# Patient Record
Sex: Female | Born: 1995 | Hispanic: No | Marital: Single | Smoking: Never smoker
Health system: Southern US, Community
[De-identification: ages and names within clinical notes are randomized; demographics above are authoritative.]

## PROBLEM LIST (undated history)

## (undated) DIAGNOSIS — D649 Anemia, unspecified: Secondary | ICD-10-CM

## (undated) DIAGNOSIS — Z8709 Personal history of other diseases of the respiratory system: Secondary | ICD-10-CM

## (undated) DIAGNOSIS — S62339A Displaced fracture of neck of unspecified metacarpal bone, initial encounter for closed fracture: Secondary | ICD-10-CM

## (undated) HISTORY — PX: WISDOM TOOTH EXTRACTION: SHX21

---

## 2015-03-23 ENCOUNTER — Encounter (HOSPITAL_COMMUNITY): Payer: Self-pay | Admitting: *Deleted

## 2015-03-23 ENCOUNTER — Emergency Department (HOSPITAL_COMMUNITY)
Admission: EM | Admit: 2015-03-23 | Discharge: 2015-03-23 | Disposition: A | Discharge status: Home / Self Care | Payer: Medicaid Other | Attending: Emergency Medicine | Admitting: Emergency Medicine

## 2015-03-23 DIAGNOSIS — N760 Acute vaginitis: Secondary | ICD-10-CM | POA: Insufficient documentation

## 2015-03-23 DIAGNOSIS — Z3202 Encounter for pregnancy test, result negative: Secondary | ICD-10-CM | POA: Diagnosis not present

## 2015-03-23 DIAGNOSIS — B9689 Other specified bacterial agents as the cause of diseases classified elsewhere: Secondary | ICD-10-CM

## 2015-03-23 DIAGNOSIS — N898 Other specified noninflammatory disorders of vagina: Secondary | ICD-10-CM | POA: Diagnosis present

## 2015-03-23 LAB — URINALYSIS, ROUTINE W REFLEX MICROSCOPIC
BILIRUBIN URINE: NEGATIVE
Glucose, UA: NEGATIVE mg/dL
HGB URINE DIPSTICK: NEGATIVE
Ketones, ur: NEGATIVE mg/dL
Nitrite: NEGATIVE
PH: 6 (ref 5.0–8.0)
Protein, ur: NEGATIVE mg/dL
SPECIFIC GRAVITY, URINE: 1.028 (ref 1.005–1.030)

## 2015-03-23 LAB — URINE MICROSCOPIC-ADD ON

## 2015-03-23 LAB — POC URINE PREG, ED: Preg Test, Ur: NEGATIVE

## 2015-03-23 LAB — WET PREP, GENITAL
Sperm: NONE SEEN
Trich, Wet Prep: NONE SEEN
YEAST WET PREP: NONE SEEN

## 2015-03-23 LAB — PREGNANCY, URINE: PREG TEST UR: NEGATIVE

## 2015-03-23 MED ORDER — METRONIDAZOLE 500 MG PO TABS: 500.0000 mg | ORAL_TABLET | Freq: Two times a day (BID) | ORAL | Status: DC

## 2015-03-23 MED ORDER — STERILE WATER FOR INJECTION IJ SOLN
INTRAMUSCULAR | Status: AC
  Administered 2015-03-23: 1 mL
  Filled 2015-03-23: qty 10

## 2015-03-23 MED ORDER — CEFTRIAXONE SODIUM 250 MG IJ SOLR
250.0000 mg | Freq: Once | INTRAMUSCULAR | Status: AC
  Administered 2015-03-23: 250 mg via INTRAMUSCULAR
  Filled 2015-03-23: qty 250

## 2015-03-23 MED ORDER — AZITHROMYCIN 250 MG PO TABS
1000.0000 mg | ORAL_TABLET | Freq: Every day | ORAL | Status: DC
  Administered 2015-03-23: 1000 mg via ORAL
  Filled 2015-03-23 (×2): qty 4

## 2015-03-23 NOTE — ED Provider Notes (Signed)
CSN: [id number]     Arrival date & time 03/23/15  1039 History   First MD Initiated Contact with Patient 03/23/15 1113     Chief Complaint  Patient presents with  . Vaginal Discharge   HPI   Amy [name] is a 20 y.o. F with no significant PMH presenting with a two month history of vaginal "spotting." She states the spotting occurs intermittently. She stopped taking her birth control at the end of November. She states her boyfriend has penile discharge, and is here in the ED getting STD tested as well. She denies fevers, chills, CP, SOB, abdominal pain, vaginal odor/itching/discharge.   History reviewed. No pertinent past medical history. History reviewed. No pertinent past surgical history. History reviewed. No pertinent family history. Social History  Substance Use Topics  . Smoking status: Never Smoker   . Smokeless tobacco: None  . Alcohol Use: Yes     Comment: social   OB History    No data available     Review of Systems  Ten systems are reviewed and are negative for acute change except as noted in the HPI  Allergies  Review of patient's allergies indicates no known allergies.  Home Medications   Prior to Admission medications   Not on File   BP 121/68 mmHg  Pulse 80  Temp(Src) 97.5 F (36.4 C) (Oral)  Resp 18  SpO2 100%  LMP 03/16/2015 Physical Exam  Constitutional: She appears well-developed and well-nourished. No distress.  HENT:  Head: Normocephalic and atraumatic.  Mouth/Throat: Oropharynx is clear and moist. No oropharyngeal exudate.  Eyes: Conjunctivae are normal. Pupils are equal, round, and reactive to light. Right eye exhibits no discharge. Left eye exhibits no discharge. No scleral icterus.  Neck: No tracheal deviation present.  Cardiovascular: Normal rate, regular rhythm, normal heart sounds and intact distal pulses.  Exam reveals no gallop and no friction rub.   No murmur heard. Pulmonary/Chest: Effort normal and breath sounds normal. No  respiratory distress. She has no wheezes. She has no rales. She exhibits no tenderness.  Abdominal: Soft. Bowel sounds are normal. She exhibits no distension and no mass. There is no tenderness. There is no rebound and no guarding.  Genitourinary:  Pelvic exam: normal external genitalia, vulva, vagina, cervix, uterus and adnexa.   Musculoskeletal: She exhibits no edema.  Lymphadenopathy:    She has no cervical adenopathy.  Neurological: She is alert. Coordination normal.  Skin: Skin is warm and dry. No rash noted. She is not diaphoretic. No erythema.  Psychiatric: She has a normal mood and affect. Her behavior is normal.  Nursing note and vitals reviewed.   ED Course  Procedures  Labs Review Labs Reviewed  RPR  HIV ANTIBODY (ROUTINE TESTING)  URINALYSIS, ROUTINE W REFLEX MICROSCOPIC (NOT AT [facility])  PREGNANCY, URINE  GC/CHLAMYDIA PROBE AMP ([facility]) NOT AT [facility]   MDM   Final diagnoses:  Bacterial vaginosis   Patient non-toxic appearing and VSS. STD testing. Hemodynamically stable.  Wet prep with BV. UA, hcg unremarkable.   Patient treated for gonorrhea/chlamydia here.   Medications  cefTRIAXone (ROCEPHIN) injection 250 mg (250 mg Intramuscular Given 03/23/15 1139)  sterile water (preservative free) injection (1 mL  Given 03/23/15 1140)   Patient feels improved after observation and/or treatment in ED.  Discussed, at length, importance of wearing condoms and using birth control to prevent STI and unwanted pregnancy. Patient may be safely discharged home with flagyl.   Discussed reasons for return. Patient to follow-up with gynecology  within one week regarding her irregular cycle. Patient in understanding and agreement with the plan.   Melton Krebs, PA-C 03/25/15 2252  Doug Sou, MD 03/27/15 [phone]

## 2015-03-23 NOTE — ED Notes (Signed)
Pt states she is spotting for 2 months.  Pt states she stopped taking birth control end of November. Pt wants to rule out STD.  Pt states no pain or medical complaints at this time.

## 2015-03-23 NOTE — Discharge Instructions (Signed)
Ms. Amy [name],  Nice meeting you! WEAR A CONDOM EVERY TIME YOU HAVE SEX. Please follow-up with your gynecologist. Return to the emergency department if you develop fevers, chills, abdominal pain. Feel better soon!  S. Lane Hacker, PA-C   Bacterial Vaginosis Bacterial vaginosis is an infection of the vagina. It happens when too many germs (bacteria) grow in the vagina. Having this infection puts you at risk for getting other infections from sex. Treating this infection can help lower your risk for other infections, such as:   Chlamydia.  Gonorrhea.  HIV.  Herpes. HOME CARE  Take your medicine as told by your doctor.  Finish your medicine even if you start to feel better.  Tell your sex partner that you have an infection. They should see their doctor for treatment.  During treatment:  Avoid sex or use condoms correctly.  Do not douche.  Do not drink alcohol unless your doctor tells you it is ok.  Do not breastfeed unless your doctor tells you it is ok. GET HELP IF:  You are not getting better after 3 days of treatment.  You have more grey fluid (discharge) coming from your vagina than before.  You have more pain than before.  You have a fever. MAKE SURE YOU:   Understand these instructions.  Will watch your condition.  Will get help right away if you are not doing well or get worse.   This information is not intended to replace advice given to you by your health care provider. Make sure you discuss any questions you have with your health care provider.   Document Released: 11/19/2007 Document Revised: 03/02/2014 Document Reviewed: 09/21/2012 Elsevier Interactive Patient Education [employer]

## 2015-03-24 LAB — RPR: RPR: NONREACTIVE

## 2015-03-24 LAB — HIV ANTIBODY (ROUTINE TESTING W REFLEX): HIV Screen 4th Generation wRfx: NONREACTIVE

## 2015-03-25 LAB — CERVICOVAGINAL ANCILLARY ONLY
CHLAMYDIA, DNA PROBE: POSITIVE — AB
NEISSERIA GONORRHEA: NEGATIVE

## 2015-03-26 ENCOUNTER — Telehealth (HOSPITAL_BASED_OUTPATIENT_CLINIC_OR_DEPARTMENT_OTHER): Payer: Self-pay | Admitting: Emergency Medicine

## 2015-03-28 NOTE — Telephone Encounter (Signed)
Spoke with pt. Verified ID. Informed of labs. Treated per protocol. Pt informed to abstain from sexual activity x 10 days and to notify partner for testing and treatment.  

## 2015-04-11 ENCOUNTER — Encounter (HOSPITAL_COMMUNITY): Payer: Self-pay | Admitting: Vascular Surgery

## 2015-04-11 ENCOUNTER — Emergency Department (HOSPITAL_COMMUNITY)
Admission: EM | Admit: 2015-04-11 | Discharge: 2015-04-11 | Disposition: A | Discharge status: Home / Self Care | Payer: Medicaid Other | Attending: Emergency Medicine | Admitting: Emergency Medicine

## 2015-04-11 DIAGNOSIS — Z202 Contact with and (suspected) exposure to infections with a predominantly sexual mode of transmission: Secondary | ICD-10-CM | POA: Insufficient documentation

## 2015-04-11 DIAGNOSIS — Z792 Long term (current) use of antibiotics: Secondary | ICD-10-CM | POA: Diagnosis not present

## 2015-04-11 DIAGNOSIS — Z7251 High risk heterosexual behavior: Secondary | ICD-10-CM

## 2015-04-11 MED ORDER — CEFTRIAXONE SODIUM 250 MG IJ SOLR
250.0000 mg | Freq: Once | INTRAMUSCULAR | Status: AC
  Administered 2015-04-11: 250 mg via INTRAMUSCULAR
  Filled 2015-04-11: qty 250

## 2015-04-11 MED ORDER — LIDOCAINE HCL (PF) 1 % IJ SOLN
0.9000 mL | Freq: Once | INTRAMUSCULAR | Status: AC
  Administered 2015-04-11: 0.9 mL
  Filled 2015-04-11: qty 5

## 2015-04-11 MED ORDER — AZITHROMYCIN 250 MG PO TABS
1000.0000 mg | ORAL_TABLET | Freq: Once | ORAL | Status: AC
  Administered 2015-04-11: 1000 mg via ORAL
  Filled 2015-04-11: qty 4

## 2015-04-11 NOTE — Discharge Instructions (Signed)
You have been treated for gonorrhea and chlamydia in the ER. DO NOT ENGAGE IN SEXUAL ACTIVITY FOR AT LEAST 10 DAYS AFTER THIS TREATMENT AS THIS WILL INVALIDATE YOUR TREATMENT HERE IF YOU ENGAGE IN SEX BEFORE THE END OF 10 DAYS AND BEFORE HAVING PARTNERS TESTED AND TREATED. ALL PARTNERS MUST BE TESTED AND TREATED FOR STD'S. Follow up with [facility] Department STD clinic for future STD concerns or screenings. This is the recommendation by the CDC for people with multiple sexual partners or history of STDs.     Sexually Transmitted Disease A sexually transmitted disease (STD) is a disease or infection often passed to another person during sex. However, STDs can be passed through nonsexual ways. An STD can be passed through:  Spit (saliva).  Semen.  Blood.  Mucus from the vagina.  Pee (urine). HOW CAN I LESSEN MY CHANCES OF GETTING AN STD?  Use:  Latex condoms.  Water-soluble lubricants with condoms. Do not use petroleum jelly or oils.  Dental dams. These are small pieces of latex that are used as a barrier during oral sex.  Avoid having more than one sex partner.  Do not have sex with someone who has other sex partners.  Do not have sex with anyone you do not know or who is at high risk for an STD.  Avoid risky sex that can break your skin.  Do not have sex if you have open sores on your mouth or skin.  Avoid drinking too much alcohol or taking illegal drugs. Alcohol and drugs can affect your good judgment.  Avoid oral and anal sex acts.  Get shots (vaccines) for HPV and hepatitis.  If you are at risk of being infected with HIV, it is advised that you take a certain medicine daily to prevent HIV infection. This is called pre-exposure prophylaxis (PrEP). You may be at risk if:  You are a man who has sex with other men (MSM).  You are attracted to the opposite sex (heterosexual) and are having sex with more than one partner.  You take drugs with a  needle.  You have sex with someone who has HIV.  Talk with your doctor about if you are at high risk of being infected with HIV. If you begin to take PrEP, get tested for HIV first. Get tested every 3 months for as long as you are taking PrEP.  Get tested for STDs every year if you are sexually active. If you are treated for an STD, get tested again 3 months after you are treated. WHAT SHOULD I DO IF I THINK I HAVE AN STD?  See your doctor.  Tell your sex partner(s) that you have an STD. They should be tested and treated.  Do not have sex until your doctor says it is okay. WHEN SHOULD I GET HELP? Get help right away if:  You have bad belly (abdominal) pain.  You are a man and have puffiness (swelling) or pain in your testicles.  You are a woman and have puffiness in your vagina.   This information is not intended to replace advice given to you by your health care provider. Make sure you discuss any questions you have with your health care provider.   Document Released: 03/19/2004 Document Revised: 03/02/2014 Document Reviewed: 08/05/2012 Elsevier Interactive Patient Education 2016 [employer].  Safe Sex Safe sex is about reducing the risk of giving or getting a sexually transmitted disease (STD). STDs are spread through sexual contact involving the  genitals, mouth, or rectum. Some STDs can be cured and others cannot. Safe sex can also prevent unintended pregnancies.  WHAT ARE SOME SAFE SEX PRACTICES?  Limit your sexual activity to only one partner who is having sex with only you.  Talk to your partner about his or her past partners, past STDs, and drug use.  Use a condom every time you have sexual intercourse. This includes vaginal, oral, and anal sexual activity. Both females and males should wear condoms during oral sex. Only use latex or polyurethane condoms and water-based lubricants. Using petroleum-based lubricants or oils to lubricate a condom will weaken the condom and  increase the chance that it will break. The condom should be in place from the beginning to the end of sexual activity. Wearing a condom reduces, but does not completely eliminate, your risk of getting or giving an STD. STDs can be spread by contact with infected body fluids and skin.  Get vaccinated for hepatitis B and HPV.  Avoid alcohol and recreational drugs, which can affect your judgment. You may forget to use a condom or participate in high-risk sex.  For females, avoid douching after sexual intercourse. Douching can spread an infection farther into the reproductive tract.  Check your body for signs of sores, blisters, rashes, or unusual discharge. See your health care provider if you notice any of these signs.  Avoid sexual contact if you have symptoms of an infection or are being treated for an STD. If you or your partner has herpes, avoid sexual contact when blisters are present. Use condoms at all other times.  If you are at risk of being infected with HIV, it is recommended that you take a prescription medicine daily to prevent HIV infection. This is called pre-exposure prophylaxis (PrEP). You are considered at risk if:  You are a man who has sex with other men (MSM).  You are a heterosexual man or woman who is sexually active with more than one partner.  You take drugs by injection.  You are sexually active with a partner who has HIV.  Talk with your health care provider about whether you are at high risk of being infected with HIV. If you choose to begin PrEP, you should first be tested for HIV. You should then be tested every 3 months for as long as you are taking PrEP.  See your health care provider for regular screenings, exams, and tests for other STDs. Before having sex with a new partner, each of you should be screened for STDs and should talk about the results with each other. WHAT ARE THE BENEFITS OF SAFE SEX?   There is less chance of getting or giving an STD.  You  can prevent unwanted or unintended pregnancies.  By discussing safe sex concerns with your partner, you may increase feelings of intimacy, comfort, trust, and honesty between the two of you.   This information is not intended to replace advice given to you by your health care provider. Make sure you discuss any questions you have with your health care provider.   Document Released: 03/19/2004 Document Revised: 03/02/2014 Document Reviewed: 08/03/2011 Elsevier Interactive Patient Education [employer]

## 2015-04-11 NOTE — ED Notes (Signed)
PT reports to the ED for eval of possible STI exposure. She had unprotected sex 1 week ago and her partner had some clear penile discharge with white specks in it so she wants to be tested. Denies any s/s such as vagin d/c, bleeding, abd pain, or urinary symptoms. Pt A&Ox4, resp e/u, and skin warm and dry.

## 2015-04-11 NOTE — ED Notes (Signed)
Patient is alert and orientedx4.  Patient was explained discharge instructions and they understood them with no questions.   

## 2015-04-11 NOTE — ED Provider Notes (Signed)
CSN: [id number]     Arrival date & time 04/11/15  2213 History  By signing my name below, I, Amy [name], attest that this documentation has been prepared under the direction and in the presence of [name], [name]. Electronically Signed: Tanda [name], ED Scribe. 04/11/2015. 10:33 PM.   Chief Complaint  Patient presents with  . Exposure to STD   Patient is a 20 y.o. female presenting with STD exposure. The history is provided by the patient and medical records. No language interpreter was used.  Exposure to STD This is a new problem. The current episode started more than 1 week ago. The problem occurs every several days. The problem has not changed since onset.Pertinent negatives include no chest pain, no abdominal pain, no headaches and no shortness of breath. Nothing aggravates the symptoms. Nothing relieves the symptoms. She has tried nothing for the symptoms. The treatment provided no relief.     HPI Comments: Amy [name] is a 20 y.o. female who presents to the Emergency Department for STD treatment. Pt states that she and her boyfriend were tested and treated for STDs on 03/23/2015 (approximately 2 weeks ago), and her chlamydia test was positive but she had been given tx on the 28th during her visit. She admits that she did not abstain from sex during the 10 day period she was told to abstain for, so she is concerned that she needs to be re-treated given that her boyfriend was seen again in the ED earlier today for penile discharge (clear with white specks) and was re-checked/re-treated for STDs again. She is here to be treated for the same again. Pt does not want to be tested for STDs and doesn't want a pelvic exam today due to being on her period at this time. She is sexually active and reports having 2 unprotected female partners within the past year, one of whom for the last 9 months which is the boyfriend she currently has.   She denies fever, chills, chest pain, shortness of breath,  abdominal pain, nausea, vomiting, diarrhea, constipation, dysuria, hematuria, vaginal discharge, genital lesions, genital ulcers, weakness, numbness, tingling, or any other associated symptoms. She is currently menstruating so she had vaginal bleeding, as expected.  History reviewed. No pertinent past medical history. History reviewed. No pertinent past surgical history. No family history on file. Social History  Substance Use Topics  . Smoking status: Never Smoker   . Smokeless tobacco: None  . Alcohol Use: Yes     Comment: social   OB History    No data available     Review of Systems  Constitutional: Negative for fever and chills.  Respiratory: Negative for shortness of breath.   Cardiovascular: Negative for chest pain.  Gastrointestinal: Negative for nausea, vomiting, abdominal pain, diarrhea and constipation.  Genitourinary: Positive for vaginal bleeding (on menses). Negative for dysuria, hematuria, vaginal discharge and genital sores.  Skin: Negative for color change.  Allergic/Immunologic: Negative for immunocompromised state.  Neurological: Negative for weakness, numbness and headaches.  10 Systems reviewed and all are negative for acute change except as noted in the HPI.   Allergies  Review of patient's allergies indicates no known allergies.  Home Medications   Prior to Admission medications   Medication Sig Start Date End Date Taking? Authorizing Provider  metroNIDAZOLE (FLAGYL) 500 MG tablet Take 1 tablet (500 mg total) by mouth 2 (two) times daily. 03/23/15   Melton Krebs, PA-C   BP 151/96 mmHg  Pulse 72  Temp(Src) 98.8  F (37.1 C) (Oral)  Resp 18  SpO2 100%  LMP 04/08/2015   Physical Exam  Constitutional: She is oriented to person, place, and time. Vital signs are normal. She appears well-developed and well-nourished.  Non-toxic appearance. No distress.  Afebrile, nontoxic, NAD  HENT:  Head: Normocephalic and atraumatic.  Mouth/Throat:  Oropharynx is clear and moist and mucous membranes are normal.  Eyes: Conjunctivae and EOM are normal. Right eye exhibits no discharge. Left eye exhibits no discharge.  Neck: Normal range of motion. Neck supple.  Cardiovascular: Normal rate, regular rhythm, normal heart sounds and intact distal pulses.  Exam reveals no gallop and no friction rub.   No murmur heard. Pulmonary/Chest: Effort normal and breath sounds normal. No respiratory distress. She has no decreased breath sounds. She has no wheezes. She has no rhonchi. She has no rales.  Abdominal: Soft. Normal appearance and bowel sounds are normal. She exhibits no distension. There is no tenderness. There is no rigidity, no rebound, no guarding, no CVA tenderness, no tenderness at McBurney's point and negative Murphy's sign.  Soft, NTND, +BS throughout, no r/g/r, neg murphy's, neg mcburney's, no CVA TTP  Genitourinary:  Refused pelvic exam.   Musculoskeletal: Normal range of motion.  Neurological: She is alert and oriented to person, place, and time. She has normal strength. No sensory deficit.  Skin: Skin is warm, dry and intact. No rash noted.  Psychiatric: She has a normal mood and affect.  Nursing note and vitals reviewed.   ED Course  Procedures (including critical care time)  DIAGNOSTIC STUDIES: Oxygen Saturation is 100% on RA, normal by my interpretation.    COORDINATION OF CARE: 10:29 PM-Discussed treatment plan which includes Zithromax and Rocephin injection with pt at bedside and pt agreed to plan.   Labs Review Labs Reviewed - No data to display Results for orders placed or performed during the hospital encounter of 03/23/15  Wet prep, genital  Result Value Ref Range   Yeast Wet Prep HPF POC NONE SEEN NONE SEEN   Trich, Wet Prep NONE SEEN NONE SEEN   Clue Cells Wet Prep HPF POC PRESENT (A) NONE SEEN   WBC, Wet Prep HPF POC MANY (A) NONE SEEN   Sperm NONE SEEN   RPR  Result Value Ref Range   RPR Ser Ql Non Reactive  Non Reactive  HIV antibody  Result Value Ref Range   HIV Screen 4th Generation wRfx Non Reactive Non Reactive  Pregnancy, urine  Result Value Ref Range   Preg Test, Ur NEGATIVE NEGATIVE  Urinalysis, Routine w reflex microscopic (not at [facility])  Result Value Ref Range   Color, Urine YELLOW YELLOW   APPearance HAZY (A) CLEAR   Specific Gravity, Urine 1.028 1.005 - 1.030   pH 6.0 5.0 - 8.0   Glucose, UA NEGATIVE NEGATIVE mg/dL   Hgb urine dipstick NEGATIVE NEGATIVE   Bilirubin Urine NEGATIVE NEGATIVE   Ketones, ur NEGATIVE NEGATIVE mg/dL   Protein, ur NEGATIVE NEGATIVE mg/dL   Nitrite NEGATIVE NEGATIVE   Leukocytes, UA SMALL (A) NEGATIVE  Urine microscopic-add on  Result Value Ref Range   Squamous Epithelial / LPF 6-30 (A) NONE SEEN   WBC, UA 0-5 0 - 5 WBC/hpf   RBC / HPF 0-5 0 - 5 RBC/hpf   Bacteria, UA FEW (A) NONE SEEN   Urine-Other MUCOUS PRESENT   POC Urine Pregnancy, ED (do NOT order at [facility])  Result Value Ref Range   Preg Test, Ur NEGATIVE NEGATIVE  Cervicovaginal ancillary  only  Result Value Ref Range   Chlamydia **POSITIVE** (A)    Neisseria gonorrhea Negative     Imaging Review No results found.    EKG Interpretation None      MDM   Final diagnoses:  Potential exposure to STD  Unprotected sexual intercourse    21 y.o. female here for re-treatment of STDs. She was seen here 2wks ago, tested +chlamydia, had been treated already during the initial ER visit, but she had intercourse before the 10 day period was finished so she's worried that she needs to be retreated. Her boyfriend had some discharge still so he came back to the ER today and was re-treated as well, so she wants this done for her. She does not want a repeat pelvic exam, since she's on her menses. She already had HIV/RPR testing which was neg. I feel it's reasonable to proceed with tx alone without testing again, since she had the testing from 2wks ago which was positive and her partner was re-treated  today as well for his ongoing symptoms. Discussed safe sex, no intercourse until 10 days from now, and f/up with health dept for any future STD concerns. I explained the diagnosis and have given explicit precautions to return to the ER including for any other new or worsening symptoms. The patient understands and accepts the medical plan as it's been dictated and I have answered their questions. Discharge instructions concerning home care and prescriptions have been given. The patient is STABLE and is discharged to home in good condition.   I personally performed the services described in this documentation, which was scribed in my presence. The recorded information has been reviewed and is accurate.  BP 151/96 mmHg  Pulse 72  Temp(Src) 98.8 F (37.1 C) (Oral)  Resp 18  SpO2 100%  LMP 04/08/2015  Meds ordered this encounter  Medications  . azithromycin (ZITHROMAX) tablet 1,000 mg    Sig:    And  . cefTRIAXone (ROCEPHIN) injection 250 mg    Sig:     Order Specific Question:  Antibiotic Indication:    Answer:  STD        Amy Sokol Camprubi-Soms, PA-C 04/11/15 2240  Glynn Octave, MD 04/11/15 2320

## 2015-04-28 ENCOUNTER — Emergency Department (HOSPITAL_COMMUNITY): Payer: Medicaid Other

## 2015-04-28 ENCOUNTER — Emergency Department (HOSPITAL_COMMUNITY)
Admission: EM | Admit: 2015-04-28 | Discharge: 2015-04-28 | Disposition: A | Discharge status: Home / Self Care | Payer: Medicaid Other | Attending: Emergency Medicine | Admitting: Emergency Medicine

## 2015-04-28 ENCOUNTER — Encounter (HOSPITAL_COMMUNITY): Payer: Self-pay | Admitting: Emergency Medicine

## 2015-04-28 DIAGNOSIS — Y9289 Other specified places as the place of occurrence of the external cause: Secondary | ICD-10-CM | POA: Insufficient documentation

## 2015-04-28 DIAGNOSIS — S6991XA Unspecified injury of right wrist, hand and finger(s), initial encounter: Secondary | ICD-10-CM | POA: Diagnosis present

## 2015-04-28 DIAGNOSIS — Z3202 Encounter for pregnancy test, result negative: Secondary | ICD-10-CM | POA: Diagnosis not present

## 2015-04-28 DIAGNOSIS — S62339A Displaced fracture of neck of unspecified metacarpal bone, initial encounter for closed fracture: Secondary | ICD-10-CM

## 2015-04-28 DIAGNOSIS — Y9389 Activity, other specified: Secondary | ICD-10-CM | POA: Diagnosis not present

## 2015-04-28 DIAGNOSIS — W2209XA Striking against other stationary object, initial encounter: Secondary | ICD-10-CM | POA: Insufficient documentation

## 2015-04-28 DIAGNOSIS — Y999 Unspecified external cause status: Secondary | ICD-10-CM | POA: Insufficient documentation

## 2015-04-28 DIAGNOSIS — S62316A Displaced fracture of base of fifth metacarpal bone, right hand, initial encounter for closed fracture: Secondary | ICD-10-CM | POA: Diagnosis not present

## 2015-04-28 HISTORY — DX: Displaced fracture of neck of unspecified metacarpal bone, initial encounter for closed fracture: S62.339A

## 2015-04-28 LAB — POC URINE PREG, ED: Preg Test, Ur: NEGATIVE

## 2015-04-28 NOTE — ED Provider Notes (Signed)
CSN: [id number]     Arrival date & time 04/28/15  6962 History  By signing my name below, I, [name], attest that this documentation has been prepared under the direction and in the presence of Benjiman Core, MD. Electronically Signed: [name], ED Scribe. 04/28/2015. 3:52 AM.    Chief Complaint  Patient presents with  . Hand Injury   The history is provided by the patient. No language interpreter was used.   HPI Comments: Amy [name] is a 20 y.o. female otherwise healthy who presents to the Emergency Department complaining of moderate right pinky finger and hand pain onset just PTA after punching an outdoor column with her right hand. Pt denies taking OTC medications at home to improve symptoms. She states that pain is worsened with movement and palpation. NKDA. Pt expresses doubt that she is pregnant. She denies numbness, additional injuries.   History reviewed. No pertinent past medical history. History reviewed. No pertinent past surgical history. No family history on file. Social History  Substance Use Topics  . Smoking status: Never Smoker   . Smokeless tobacco: None  . Alcohol Use: Yes     Comment: social   OB History    No data available     Review of Systems  Respiratory: Negative for shortness of breath.   Gastrointestinal: Negative for abdominal pain.  Musculoskeletal: Positive for arthralgias.  Neurological: Negative for weakness and numbness.   Allergies  Review of patient's allergies indicates no known allergies.  Home Medications   Prior to Admission medications   Not on File   BP 135/89 mmHg  Pulse 104  Temp(Src) 98.7 F (37.1 C) (Oral)  Resp 20  SpO2 100%  LMP 04/08/2015 Physical Exam  Constitutional: She appears well-developed and well-nourished.  HENT:  Head: Normocephalic and atraumatic.  Cardiovascular: Normal rate.   Musculoskeletal: Normal range of motion. She exhibits tenderness.   Tenderness and deformity of the distal 5th  metacarpal. Skin intact. Strong radial pulse. NVI over the right little finger.   Neurological: She is alert.  Skin: Skin is warm and dry.  Psychiatric: She has a normal mood and affect.  Nursing note and vitals reviewed.   ED Course  Procedures (including critical care time) DIAGNOSTIC STUDIES: Oxygen Saturation is 100% on RA, normal by my interpretation.    COORDINATION OF CARE: 3:49 AM Discussed treatment plan with pt at bedside which includes XR and pt agreed to plan.    Imaging Review Dg Hand Complete Right  04/28/2015  CLINICAL DATA:  Punched pillar, with right fifth metacarpal swelling and deformity. Initial encounter. EXAM: RIGHT HAND - COMPLETE 3+ VIEW COMPARISON:  None. FINDINGS: There is a comminuted fracture of the distal fifth metacarpal, with radial displacement and radial and volar angulation. The distal fragment is rotated radially. Associated soft tissue swelling is noted. No additional fractures are seen. The carpal rows appear grossly intact, and demonstrate normal alignment. IMPRESSION: Comminuted fracture of the distal fifth metacarpal, with radial displacement and radial and volar angulation. The distal fragment is rotated radially. Electronically Signed   By: Roanna Raider M.D.   On: 04/28/2015 05:05   I have personally reviewed and evaluated these images as part of my medical decision-making.  MDM   Final diagnoses:  Boxer's fracture, closed, initial encounter    Patient with closed fracture of the distal right fifth metacarpal. She is right-handed. Discussed with Dr. Merlyn Lot, who reviewed the x-rays. Will likely need a pin but will see in follow-up on Monday.  I personally performed the services described in this documentation, which was scribed in my presence. The recorded information has been reviewed and is accurate.     Benjiman CoreNathan Tasmia Blumer, MD 04/28/15 [phone]

## 2015-04-28 NOTE — ED Notes (Signed)
Pt hit a hard service with R hand. C.o pain to area.

## 2015-04-28 NOTE — ED Notes (Signed)
Ortho tech paged  

## 2015-04-28 NOTE — Discharge Instructions (Signed)
Metacarpal Fracture °A metacarpal fracture is a break (fracture) of a bone in the hand. Metacarpals are the bones that extend from your knuckles to your wrist. In each hand, you have five metacarpal bones that connect your fingers and your thumb to your wrist. °Some hand fractures have bone pieces that are close together and stable (simple). These fractures may be treated with only a splint or cast. Hand fractures that have many pieces of broken bone (comminuted), unstable bone pieces (displaced), or a bone that breaks through the skin (compound) usually require surgery. °CAUSES °This injury may be caused by: °· A fall. °· A hard, direct hit to your hand. °· An injury that squeezes your knuckle, stretches your finger out of place, or crushes your hand. °RISK FACTORS °This injury is more likely to occur if: °· You play contact sports. °· You have certain bone diseases. °SYMPTOMS  °Symptoms of this type of fracture develop soon after the injury. Symptoms may include: °· Swelling. °· Pain. °· Stiffness. °· Increased pain with movement. °· Bruising. °· Inability to move a finger. °· A shortened finger. °· A finger knuckle that looks sunken in. °· Unusual appearance of the hand or finger (deformity). °DIAGNOSIS  °This injury may be diagnosed based on your signs and symptoms, especially if you had a recent hand injury. Your health care provider will perform a physical exam. He or she may also order X-rays to confirm the diagnosis.  °TREATMENT  °Treatment for this injury depends on the type of fracture you have and how severe it is. Possible treatments include: °· Non-reduction. This can be done if the bone does not need to be moved back into place. The fracture can be casted or splinted as it is.   °· Closed reduction. If your bone is stable and can be moved back into place, you may only need to wear a cast or splint or have buddy taping. °· Closed reduction with internal fixation (CRIF). This is the most common  treatment. You may have this procedure if your bone can be moved back into place but needs more support. Wires, pins, or screws may be inserted through your skin to stabilize the fracture. °· Open reduction with internal fixation (ORIF). This may be needed if your fracture is severe and unstable. It involves surgery to move your bone back into the right position. Screws, wires, or plates are used to stabilize the fracture. °After all procedures, you may need to wear a cast or a splint for several weeks. You will also need to have follow-up X-rays to make sure that the bone is healing well and staying in position. After you no longer need your cast or splint, you may need physical therapy. This will help you to regain full movement and strength in your hand.  °HOME CARE INSTRUCTIONS  °If You Have a Cast: °· Do not stick anything inside the cast to scratch your skin. Doing that increases your risk of infection. °· Check the skin around the cast every day. Report any concerns to your health care provider. You may put lotion on dry skin around the edges of the cast. Do not apply lotion to the skin underneath the cast. °If You Have a Splint: °· Wear it as directed by your health care provider. Remove it only as directed by your health care provider. °· Loosen the splint if your fingers become numb and tingle, or if they turn cold and blue. °Bathing °· Cover the cast or splint with a   watertight plastic bag to protect it from water while you take a bath or a shower. Do not let the cast or splint get wet. °Managing Pain, Stiffness, and Swelling °· If directed, apply ice to the injured area (if you have a splint, not a cast): °¨ Put ice in a plastic bag. °¨ Place a towel between your skin and the bag. °¨ Leave the ice on for 20 minutes, 2-3 times a day. °· Move your fingers often to avoid stiffness and to lessen swelling. °· Raise the injured area above the level of your heart while you are sitting or lying  down. °Driving °· Do not drive or operate heavy machinery while taking pain medicine. °· Do not drive while wearing a cast or splint on a hand that you use for driving. °Activity °· Return to your normal activities as directed by your health care provider. Ask your health care provider what activities are safe for you. °General Instructions °· Do not put pressure on any part of the cast or splint until it is fully hardened. This may take several hours. °· Keep the cast or splint clean and dry. °· Do not use any tobacco products, including cigarettes, chewing tobacco, or electronic cigarettes. Tobacco can delay bone healing. If you need help quitting, ask your health care provider. °· Take medicines only as directed by your health care provider. °· Keep all follow-up visits as directed by your health care provider. This is important. °SEEK MEDICAL CARE IF:  °· Your pain is getting worse. °· You have redness, swelling, or pain in the injured area.   °· You have fluid, blood, or pus coming from under your cast or splint.   °· You notice a bad smell coming from under your cast or splint.   °· You have a fever.   °SEEK IMMEDIATE MEDICAL CARE IF:  °· You develop a rash.   °· You have trouble breathing.   °· Your skin or nails on your injured hand turn blue or gray even after you loosen your splint. °· Your injured hand feels cold or becomes numb even after you loosen your splint.   °· You develop severe pain under the cast or in your hand. °  °This information is not intended to replace advice given to you by your health care provider. Make sure you discuss any questions you have with your health care provider. °  °Document Released: 02/09/2005 Document Revised: 10/31/2014 Document Reviewed: 11/29/2013 °Elsevier Interactive Patient Education ©2016 [employer] ° °

## 2015-04-28 NOTE — ED Notes (Signed)
Patient verbalized understanding of discharge instructions and denies any further needs or questions at this time. VS stable. Patient ambulatory with steady gait.  

## 2015-05-03 ENCOUNTER — Encounter (HOSPITAL_BASED_OUTPATIENT_CLINIC_OR_DEPARTMENT_OTHER): Payer: Self-pay | Admitting: *Deleted

## 2015-05-03 ENCOUNTER — Other Ambulatory Visit: Payer: Self-pay | Admitting: Orthopedic Surgery

## 2015-05-07 ENCOUNTER — Ambulatory Visit (HOSPITAL_BASED_OUTPATIENT_CLINIC_OR_DEPARTMENT_OTHER)
Admission: RE | Admit: 2015-05-07 | Discharge: 2015-05-07 | Disposition: A | Discharge status: Home / Self Care | Payer: Medicaid Other | Source: Ambulatory Visit | Attending: Orthopedic Surgery | Admitting: Orthopedic Surgery

## 2015-05-07 ENCOUNTER — Encounter (HOSPITAL_BASED_OUTPATIENT_CLINIC_OR_DEPARTMENT_OTHER)
Admission: RE | Disposition: A | Discharge status: Home / Self Care | Payer: Self-pay | Source: Ambulatory Visit | Attending: Orthopedic Surgery

## 2015-05-07 ENCOUNTER — Ambulatory Visit (HOSPITAL_BASED_OUTPATIENT_CLINIC_OR_DEPARTMENT_OTHER): Payer: Medicaid Other | Admitting: Anesthesiology

## 2015-05-07 ENCOUNTER — Encounter (HOSPITAL_BASED_OUTPATIENT_CLINIC_OR_DEPARTMENT_OTHER): Payer: Self-pay

## 2015-05-07 DIAGNOSIS — W2209XA Striking against other stationary object, initial encounter: Secondary | ICD-10-CM | POA: Insufficient documentation

## 2015-05-07 DIAGNOSIS — S62336A Displaced fracture of neck of fifth metacarpal bone, right hand, initial encounter for closed fracture: Secondary | ICD-10-CM | POA: Insufficient documentation

## 2015-05-07 HISTORY — PX: CLOSED REDUCTION METACARPAL WITH PERCUTANEOUS PINNING: SHX5613

## 2015-05-07 HISTORY — DX: Displaced fracture of neck of unspecified metacarpal bone, initial encounter for closed fracture: S62.339A

## 2015-05-07 HISTORY — DX: Anemia, unspecified: D64.9

## 2015-05-07 HISTORY — DX: Personal history of other diseases of the respiratory system: Z87.09

## 2015-05-07 SURGERY — CLOSED REDUCTION, FRACTURE, METACARPAL BONE, WITH PERCUTANEOUS PINNING
Anesthesia: General | Site: Hand | Laterality: Right

## 2015-05-07 MED ORDER — MEPERIDINE HCL 25 MG/ML IJ SOLN: 6.2500 mg | INTRAMUSCULAR | Status: DC | PRN

## 2015-05-07 MED ORDER — GLYCOPYRROLATE 0.2 MG/ML IJ SOLN: 0.2000 mg | Freq: Once | INTRAMUSCULAR | Status: DC | PRN

## 2015-05-07 MED ORDER — HYDROMORPHONE HCL 1 MG/ML IJ SOLN
INTRAMUSCULAR | Status: AC
  Filled 2015-05-07: qty 1

## 2015-05-07 MED ORDER — ONDANSETRON HCL 4 MG/2ML IJ SOLN
INTRAMUSCULAR | Status: AC
  Filled 2015-05-07: qty 2

## 2015-05-07 MED ORDER — LIDOCAINE HCL (CARDIAC) 20 MG/ML IV SOLN
INTRAVENOUS | Status: DC | PRN
  Administered 2015-05-07: 100 mg via INTRAVENOUS

## 2015-05-07 MED ORDER — HYDROMORPHONE HCL 1 MG/ML IJ SOLN
0.2500 mg | INTRAMUSCULAR | Status: DC | PRN
  Administered 2015-05-07 (×3): 0.5 mg via INTRAVENOUS

## 2015-05-07 MED ORDER — CHLORHEXIDINE GLUCONATE 4 % EX LIQD: 60.0000 mL | Freq: Once | CUTANEOUS | Status: DC

## 2015-05-07 MED ORDER — SCOPOLAMINE 1 MG/3DAYS TD PT72
1.0000 | MEDICATED_PATCH | Freq: Once | TRANSDERMAL | Status: DC | PRN
Start: 2015-05-07 — End: 2015-05-07

## 2015-05-07 MED ORDER — PROPOFOL 10 MG/ML IV BOLUS
INTRAVENOUS | Status: AC
  Filled 2015-05-07: qty 20

## 2015-05-07 MED ORDER — LACTATED RINGERS IV SOLN
INTRAVENOUS | Status: DC
  Administered 2015-05-07: 12:00:00 via INTRAVENOUS

## 2015-05-07 MED ORDER — OXYCODONE HCL 5 MG PO TABS: 5.0000 mg | ORAL_TABLET | Freq: Once | ORAL | Status: DC | PRN

## 2015-05-07 MED ORDER — OXYCODONE-ACETAMINOPHEN 5-325 MG PO TABS
ORAL_TABLET | ORAL | Status: AC
Start: 2015-05-07 — End: ?

## 2015-05-07 MED ORDER — DEXAMETHASONE SODIUM PHOSPHATE 4 MG/ML IJ SOLN
INTRAMUSCULAR | Status: DC | PRN
  Administered 2015-05-07: 10 mg via INTRAVENOUS

## 2015-05-07 MED ORDER — ONDANSETRON HCL 4 MG/2ML IJ SOLN
INTRAMUSCULAR | Status: DC | PRN
  Administered 2015-05-07: 4 mg via INTRAVENOUS

## 2015-05-07 MED ORDER — OXYCODONE HCL 5 MG/5ML PO SOLN: 5.0000 mg | Freq: Once | ORAL | Status: DC | PRN

## 2015-05-07 MED ORDER — BUPIVACAINE HCL (PF) 0.25 % IJ SOLN
INTRAMUSCULAR | Status: DC | PRN
  Administered 2015-05-07: 8 mL

## 2015-05-07 MED ORDER — FENTANYL CITRATE (PF) 100 MCG/2ML IJ SOLN
50.0000 ug | INTRAMUSCULAR | Status: DC | PRN
  Administered 2015-05-07: 100 ug via INTRAVENOUS

## 2015-05-07 MED ORDER — CEFAZOLIN SODIUM-DEXTROSE 2-3 GM-% IV SOLR
INTRAVENOUS | Status: AC
  Filled 2015-05-07: qty 50

## 2015-05-07 MED ORDER — CEFAZOLIN SODIUM-DEXTROSE 2-3 GM-% IV SOLR
2.0000 g | INTRAVENOUS | Status: AC
  Administered 2015-05-07: 2 g via INTRAVENOUS

## 2015-05-07 MED ORDER — MIDAZOLAM HCL 2 MG/2ML IJ SOLN: 1.0000 mg | INTRAMUSCULAR | Status: DC | PRN

## 2015-05-07 MED ORDER — PROPOFOL 10 MG/ML IV BOLUS
INTRAVENOUS | Status: DC | PRN
  Administered 2015-05-07: 200 mg via INTRAVENOUS

## 2015-05-07 MED ORDER — FENTANYL CITRATE (PF) 100 MCG/2ML IJ SOLN
INTRAMUSCULAR | Status: AC
  Filled 2015-05-07: qty 2

## 2015-05-07 MED ORDER — LIDOCAINE HCL (CARDIAC) 20 MG/ML IV SOLN
INTRAVENOUS | Status: AC
  Filled 2015-05-07: qty 5

## 2015-05-07 MED ORDER — DEXAMETHASONE SODIUM PHOSPHATE 10 MG/ML IJ SOLN
INTRAMUSCULAR | Status: AC
  Filled 2015-05-07: qty 1

## 2015-05-07 SURGICAL SUPPLY — 57 items
BANDAGE ACE 3X5.8 VEL STRL LF (GAUZE/BANDAGES/DRESSINGS) ×4 IMPLANT
BLADE MINI RND TIP GREEN BEAV (BLADE) IMPLANT
BLADE SURG 15 STRL LF DISP TIS (BLADE) IMPLANT
BLADE SURG 15 STRL SS (BLADE)
BNDG ELASTIC 2X5.8 VLCR STR LF (GAUZE/BANDAGES/DRESSINGS) IMPLANT
BNDG ESMARK 4X9 LF (GAUZE/BANDAGES/DRESSINGS) ×4 IMPLANT
BNDG GAUZE ELAST 4 BULKY (GAUZE/BANDAGES/DRESSINGS) ×4 IMPLANT
CHLORAPREP W/TINT 26ML (MISCELLANEOUS) ×4 IMPLANT
CORDS BIPOLAR (ELECTRODE) IMPLANT
COVER BACK TABLE 60X90IN (DRAPES) ×4 IMPLANT
COVER MAYO STAND STRL (DRAPES) ×4 IMPLANT
CUFF TOURNIQUET SINGLE 18IN (TOURNIQUET CUFF) ×4 IMPLANT
DRAPE EXTREMITY T 121X128X90 (DRAPE) ×4 IMPLANT
DRAPE OEC MINIVIEW 54X84 (DRAPES) ×4 IMPLANT
DRAPE SURG 17X23 STRL (DRAPES) ×4 IMPLANT
GAUZE SPONGE 4X4 12PLY STRL (GAUZE/BANDAGES/DRESSINGS) ×4 IMPLANT
GAUZE XEROFORM 1X8 LF (GAUZE/BANDAGES/DRESSINGS) ×4 IMPLANT
GLOVE BIO SURGEON STRL SZ7.5 (GLOVE) ×4 IMPLANT
GLOVE BIOGEL PI IND STRL 7.0 (GLOVE) ×4 IMPLANT
GLOVE BIOGEL PI IND STRL 8 (GLOVE) ×2 IMPLANT
GLOVE BIOGEL PI IND STRL 8.5 (GLOVE) ×2 IMPLANT
GLOVE BIOGEL PI INDICATOR 7.0 (GLOVE) ×4
GLOVE BIOGEL PI INDICATOR 8 (GLOVE) ×2
GLOVE BIOGEL PI INDICATOR 8.5 (GLOVE) ×2
GLOVE SURG ORTHO 8.0 STRL STRW (GLOVE) ×4 IMPLANT
GOWN STRL REUS W/ TWL LRG LVL3 (GOWN DISPOSABLE) ×2 IMPLANT
GOWN STRL REUS W/ TWL XL LVL3 (GOWN DISPOSABLE) ×4 IMPLANT
GOWN STRL REUS W/TWL LRG LVL3 (GOWN DISPOSABLE) ×2
GOWN STRL REUS W/TWL XL LVL3 (GOWN DISPOSABLE) ×12 IMPLANT
K-WIRE .035X4 (WIRE) ×8 IMPLANT
NEEDLE HYPO 22GX1.5 SAFETY (NEEDLE) IMPLANT
NEEDLE HYPO 25X1 1.5 SAFETY (NEEDLE) ×4 IMPLANT
NS IRRIG 1000ML POUR BTL (IV SOLUTION) ×4 IMPLANT
PACK BASIN DAY SURGERY FS (CUSTOM PROCEDURE TRAY) ×4 IMPLANT
PAD CAST 3X4 CTTN HI CHSV (CAST SUPPLIES) ×2 IMPLANT
PAD CAST 4YDX4 CTTN HI CHSV (CAST SUPPLIES) ×2 IMPLANT
PADDING CAST ABS 4INX4YD NS (CAST SUPPLIES) ×2
PADDING CAST ABS COTTON 4X4 ST (CAST SUPPLIES) ×2 IMPLANT
PADDING CAST COTTON 3X4 STRL (CAST SUPPLIES) ×2
PADDING CAST COTTON 4X4 STRL (CAST SUPPLIES) ×2
SLEEVE SCD COMPRESS KNEE MED (MISCELLANEOUS) ×4 IMPLANT
SPLINT PLASTER CAST XFAST 3X15 (CAST SUPPLIES) ×40 IMPLANT
SPLINT PLASTER CAST XFAST 4X15 (CAST SUPPLIES) IMPLANT
SPLINT PLASTER XTRA FAST SET 4 (CAST SUPPLIES)
SPLINT PLASTER XTRA FASTSET 3X (CAST SUPPLIES) ×40
STOCKINETTE 4X48 STRL (DRAPES) ×4 IMPLANT
SUT CHROMIC 4 0 PS 2 18 (SUTURE) IMPLANT
SUT ETHILON 3 0 PS 1 (SUTURE) IMPLANT
SUT ETHILON 4 0 PS 2 18 (SUTURE) IMPLANT
SUT MERSILENE 4 0 P 3 (SUTURE) IMPLANT
SUT VIC AB 3-0 PS1 18 (SUTURE)
SUT VIC AB 3-0 PS1 18XBRD (SUTURE) IMPLANT
SUT VICRYL 4-0 PS2 18IN ABS (SUTURE) ×4 IMPLANT
SYR BULB 3OZ (MISCELLANEOUS) IMPLANT
SYR CONTROL 10ML LL (SYRINGE) ×4 IMPLANT
TOWEL OR 17X24 6PK STRL BLUE (TOWEL DISPOSABLE) ×4 IMPLANT
UNDERPAD 30X30 (UNDERPADS AND DIAPERS) ×4 IMPLANT

## 2015-05-07 NOTE — Op Note (Signed)
Intra-operative fluoroscopic images in the AP, lateral, and oblique views were taken and evaluated by myself.  Reduction and hardware placement were confirmed.  There was no intraarticular penetration of permanent hardware.  

## 2015-05-07 NOTE — Anesthesia Postprocedure Evaluation (Signed)
Anesthesia Post Note  Patient: Amy [name]  Procedure(s) Performed: Procedure(s) (LRB): CLOSED REDUCTION WITH PERCUTANEOUS PINNING RIGHT SMALL FINGER METACARPAL NECK INTRA ARTICULAR FRACTURE (Right)  Patient location during evaluation: PACU Anesthesia Type: General Level of consciousness: awake and alert Pain management: pain level controlled Vital Signs Assessment: post-procedure vital signs reviewed and stable Respiratory status: spontaneous breathing, nonlabored ventilation and respiratory function stable Cardiovascular status: blood pressure returned to baseline and stable Postop Assessment: no signs of nausea or vomiting Anesthetic complications: no    Last Vitals:  Filed Vitals:   05/07/15 1400 05/07/15 1415  BP: 124/80 126/88  Pulse: 84 73  Temp: 37.1 C   Resp: 16 16    Last Pain:  Filed Vitals:   05/07/15 1421  PainSc: 6                  [name]

## 2015-05-07 NOTE — Discharge Instructions (Addendum)

## 2015-05-07 NOTE — Op Note (Signed)
I assisted Surgeon(s) and Role:    * Betha LoaKevin Glenys Snader, MD - Primary    * Cindee SaltGary Banesa Tristan, MD - Assisting on the Procedure(s): CLOSED REDUCTION WITH PERCUTANEOUS PINNING RIGHT SMALL FINGER METACARPAL NECK INTRA ARTICULAR FRACTURE on 05/07/2015.  I provided assistance on this case as follows: reduction and stabization for fixation and splint application.  Electronically signed by: Nicki ReaperKUZMA,Tzipporah Nagorski R, MD Date: 05/07/2015 Time: 3:35 PM

## 2015-05-07 NOTE — H&P (Signed)
  Amy FeltLeiya [name] is an 20 y.o. female.   Chief Complaint: right small finger metacarpal fracture HPI: 20 yo female states she punched a column last week injuring right hand.  [facility] revealed small finger metacarpal neck fracture.  Splinted and followed up in office.  She reports no previous injury to hand and no other injury at this time.  Allergies: No Known Allergies  Past Medical History  Diagnosis Date  . Anemia     no current med.  . Fracture, metacarpal, neck 04/28/2015    right  . History of asthma     no inhaler use in > 1 yr, per pt.    Past Surgical History  Procedure Laterality Date  . Wisdom tooth extraction      Family History: History reviewed. No pertinent family history.  Social History:   reports that she has never smoked. She has never used smokeless tobacco. She reports that she drinks alcohol. She reports that she uses illicit drugs (Marijuana).  Medications: Medications Prior to Admission  Medication Sig Dispense Refill  . ibuprofen (ADVIL,MOTRIN) 200 MG tablet Take 200 mg by mouth every 6 (six) hours as needed.      No results found for this or any previous visit (from the past 48 hour(s)).  No results found.   A comprehensive review of systems was negative.   Blood pressure 131/69, pulse 72, temperature 98.5 F (36.9 C), temperature source Oral, resp. rate 18, height 5\' 8"  (1.727 m), weight 80.4 kg (177 lb 4 oz), last menstrual period 04/08/2015, SpO2 100 %.  General appearance: alert, cooperative and appears stated age Head: Normocephalic, without obvious abnormality, atraumatic Neck: supple, symmetrical, trachea midline Resp: clear to auscultation bilaterally Cardio: regular rate and rhythm GI: non-tender Extremities: Intact sensation and capillary refill all digits.  +epl/fpl/io.  No wounds.  Pulses: 2+ and symmetric Skin: Skin color, texture, turgor normal. No rashes or lesions Neurologic: Grossly  normal Incision/Wound: none  Assessment/Plan Right small finger metacarpal neck fracture.  Non operative and operative treatment options were discussed with the patient and patient wishes to proceed with operative treatment. Risks, benefits, and alternatives of surgery were discussed and the patient agrees with the plan of care.   Amy [name] R 05/07/2015, 1:02 PM

## 2015-05-07 NOTE — Brief Op Note (Signed)
05/07/2015  1:52 PM  PATIENT:  Evans LanceLeiya Knupp  20 y.o. female  PRE-OPERATIVE DIAGNOSIS:  right small metacarpal neck fracture  POST-OPERATIVE DIAGNOSIS:  right small metacarpal neck fracture  PROCEDURE:  Procedure(s): CLOSED REDUCTION WITH PERCUTANEOUS PINNING VS OPEN REDUCTION INTERNAL FIXATION RIGHT SMALL FINGER METACARPAL NECK INTRA ARTICULAR FRACTURE (Right)  SURGEON:  Surgeon(s) and Role:    * Betha LoaKevin Christabella Alvira, MD - Primary    * Cindee SaltGary Staisha Winiarski, MD - Assisting  PHYSICIAN ASSISTANT:   ASSISTANTS: Cindee SaltGary Susie Ehresman, MD   ANESTHESIA:   general  EBL:     BLOOD ADMINISTERED:none  DRAINS: none   LOCAL MEDICATIONS USED:  MARCAINE     SPECIMEN:  No Specimen  DISPOSITION OF SPECIMEN:  N/A  COUNTS:  YES  TOURNIQUET:   Total Tourniquet Time Documented: Upper Arm (Right) - 17 minutes Total: Upper Arm (Right) - 17 minutes   DICTATION: .Other Dictation: Dictation Number [id number]  PLAN OF CARE: Discharge to home after PACU  PATIENT DISPOSITION:  PACU - hemodynamically stable.

## 2015-05-07 NOTE — Anesthesia Procedure Notes (Signed)
Procedure Name: LMA Insertion Date/Time: 05/07/2015 1:22 PM Performed by: Gar GibbonKEETON, Padme Arriaga S Pre-anesthesia Checklist: Patient identified, Emergency Drugs available, Suction available and Patient being monitored Patient Re-evaluated:Patient Re-evaluated prior to inductionOxygen Delivery Method: Circle System Utilized Preoxygenation: Pre-oxygenation with 100% oxygen Intubation Type: IV induction Ventilation: Mask ventilation without difficulty LMA: LMA inserted LMA Size: 4.0 Number of attempts: 1 Airway Equipment and Method: Bite block Placement Confirmation: positive ETCO2 Tube secured with: Tape Dental Injury: Teeth and Oropharynx as per pre-operative assessment

## 2015-05-07 NOTE — Transfer of Care (Signed)
Immediate Anesthesia Transfer of Care Note  Patient: Amy [name]  Procedure(s) Performed: Procedure(s): CLOSED REDUCTION WITH PERCUTANEOUS PINNING VS OPEN REDUCTION INTERNAL FIXATION RIGHT SMALL FINGER METACARPAL NECK INTRA ARTICULAR FRACTURE (Right)  Patient Location: PACU  Anesthesia Type:General  Level of Consciousness: awake, sedated and patient cooperative  Airway & Oxygen Therapy: Patient Spontanous Breathing and Patient connected to face mask oxygen  Post-op Assessment: Report given to RN and Post -op Vital signs reviewed and stable  Post vital signs: Reviewed and stable  Last Vitals:  Filed Vitals:   05/07/15 1159  BP: 131/69  Pulse: 72  Temp: 36.9 C  Resp: 18    Complications: No apparent anesthesia complications

## 2015-05-07 NOTE — Anesthesia Preprocedure Evaluation (Signed)

## 2015-05-07 NOTE — Op Note (Signed)
[id number] 

## 2015-05-08 ENCOUNTER — Encounter (HOSPITAL_BASED_OUTPATIENT_CLINIC_OR_DEPARTMENT_OTHER): Payer: Self-pay | Admitting: Orthopedic Surgery

## 2015-05-08 ENCOUNTER — Emergency Department (HOSPITAL_COMMUNITY)
Admission: EM | Admit: 2015-05-08 | Discharge: 2015-05-08 | Disposition: A | Discharge status: Home / Self Care | Payer: Medicaid Other | Attending: Emergency Medicine | Admitting: Emergency Medicine

## 2015-05-08 DIAGNOSIS — Z862 Personal history of diseases of the blood and blood-forming organs and certain disorders involving the immune mechanism: Secondary | ICD-10-CM | POA: Insufficient documentation

## 2015-05-08 DIAGNOSIS — Z3490 Encounter for supervision of normal pregnancy, unspecified, unspecified trimester: Secondary | ICD-10-CM

## 2015-05-08 DIAGNOSIS — Z8781 Personal history of (healed) traumatic fracture: Secondary | ICD-10-CM | POA: Insufficient documentation

## 2015-05-08 DIAGNOSIS — J45909 Unspecified asthma, uncomplicated: Secondary | ICD-10-CM | POA: Insufficient documentation

## 2015-05-08 DIAGNOSIS — Z3201 Encounter for pregnancy test, result positive: Secondary | ICD-10-CM | POA: Diagnosis not present

## 2015-05-08 DIAGNOSIS — Z3200 Encounter for pregnancy test, result unknown: Secondary | ICD-10-CM | POA: Diagnosis present

## 2015-05-08 LAB — POC URINE PREG, ED: PREG TEST UR: POSITIVE — AB

## 2015-05-08 NOTE — ED Notes (Signed)
Declined W/C at D/C and was escorted to lobby by RN. 

## 2015-05-08 NOTE — ED Provider Notes (Signed)
CSN: [id number]     Arrival date & time 05/08/15  1212 History  By signing my name below, I, Amy [name], attest that this documentation has been prepared under the direction and in the presence of Amy SchaumannLeslie Kaelum Kissick, PA-C. Electronically Signed: Angelene GiovanniEmmanuella [name], ED Scribe. 05/08/2015. 1:50 PM.     Chief Complaint  Patient presents with  . Possible Pregnancy   Patient is a 20 y.o. female presenting with pregnancy problem. The history is provided by the patient. No language interpreter was used.  Possible Pregnancy This is a new problem. The current episode started yesterday. Pertinent negatives include no abdominal pain. Nothing aggravates the symptoms. Nothing relieves the symptoms. She has tried nothing for the symptoms.   HPI Comments: Amy [name] is a 20 y.o. female who presents to the Emergency Department requesting an evaluation for possible pregnancy. She explains that she had 2 positive pregnancy tests at home. Pt denies any abdominal pain. She states that her LNMP was approx. 04/08/15. No other complaints at this time.    Past Medical History  Diagnosis Date  . Anemia     no current med.  . Fracture, metacarpal, neck 04/28/2015    right  . History of asthma     no inhaler use in > 1 yr, per pt.   Past Surgical History  Procedure Laterality Date  . Wisdom tooth extraction    . Closed reduction metacarpal with percutaneous pinning Right 05/07/2015    Procedure: CLOSED REDUCTION WITH PERCUTANEOUS PINNING RIGHT SMALL FINGER METACARPAL NECK INTRA ARTICULAR FRACTURE;  Surgeon: Betha LoaKevin Kuzma, MD;  Location: [facility];  Service: Orthopedics;  Laterality: Right;   History reviewed. No pertinent family history. Social History  Substance Use Topics  . Smoking status: Never Smoker   . Smokeless tobacco: Never Used  . Alcohol Use: Yes     Comment: occasionally   OB History    No data available     Review of Systems  Constitutional:       Evaluation for possible  pregnancy  Gastrointestinal: Negative for nausea, vomiting and abdominal pain.      Allergies  Review of patient's allergies indicates no known allergies.  Home Medications   Prior to Admission medications   Medication Sig Start Date End Date Taking? Authorizing Provider  ibuprofen (ADVIL,MOTRIN) 200 MG tablet Take 200 mg by mouth every 6 (six) hours as needed.    Historical Provider, MD  oxyCODONE-acetaminophen (PERCOCET) 5-325 MG tablet 1-2 tabs po q6 hours prn pain 05/07/15   Betha LoaKevin Kuzma, MD   BP 139/86 mmHg  Pulse 73  Temp(Src) 98.2 F (36.8 C)  Resp 16  Ht 5\' 8"  (1.727 m)  Wt 177 lb 4 oz (80.4 kg)  BMI 26.96 kg/m2  SpO2 100%  LMP 04/08/2015 Physical Exam  Constitutional: She is oriented to person, place, and time. She appears well-developed and well-nourished. No distress.  HENT:  Head: Normocephalic and atraumatic.  Eyes: Conjunctivae and EOM are normal.  Neck: Neck supple. No tracheal deviation present.  Cardiovascular: Normal rate.   Pulmonary/Chest: Effort normal. No respiratory distress.  Musculoskeletal: Normal range of motion.  Neurological: She is alert and oriented to person, place, and time.  Skin: Skin is warm and dry.  Psychiatric: She has a normal mood and affect. Her behavior is normal.  Nursing note and vitals reviewed.   ED Course  Procedures (including critical care time) DIAGNOSTIC STUDIES: Oxygen Saturation is 100% on RA, normal by my interpretation.    COORDINATION  OF CARE: 1:48 PM- Pt advised of plan for treatment and pt agrees. Pt informed of positive pregnancy results. Advised to follow up with    Labs Review Labs Reviewed  POC URINE PREG, ED - Abnormal; Notable for the following:    Preg Test, Ur POSITIVE (*)    All other components within normal limits    Amy Schaumann, PA-C has personally reviewed and evaluated these images and lab results as part of her medical decision-making.  MDM   Final diagnoses:  Pregnancy  Patient's  last menstrual period was 04/08/2015. An After Visit Summary was printed and given to the patient.  Lonia Skinner [place], PA-C 05/08/15 [address] [place], PA-C 05/08/15 1408  Gwyneth Sprout, MD 05/09/15 [phone]
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
## 2015-05-08 NOTE — ED Notes (Signed)
PT reports  2 positive pregnancy tests at home.

## 2015-05-08 NOTE — Discharge Instructions (Signed)
First Trimester of Pregnancy The first trimester of pregnancy is from week 1 until the end of week 12 (months 1 through 3). A week after a sperm fertilizes an egg, the egg will implant on the wall of the uterus. This embryo will begin to develop into a baby. Genes from you and your partner are forming the baby. The female genes determine whether the baby is a boy or a girl. At 6-8 weeks, the eyes and face are formed, and the heartbeat can be seen on ultrasound. At the end of 12 weeks, all the baby's organs are formed.  Now that you are pregnant, you will want to do everything you can to have a healthy baby. Two of the most important things are to get good prenatal care and to follow your health care provider's instructions. Prenatal care is all the medical care you receive before the baby's birth. This care will help prevent, find, and treat any problems during the pregnancy and childbirth. BODY CHANGES Your body goes through many changes during pregnancy. The changes vary from woman to woman.   You may gain or lose a couple of pounds at first.  You may feel sick to your stomach (nauseous) and throw up (vomit). If the vomiting is uncontrollable, call your health care provider.  You may tire easily.  You may develop headaches that can be relieved by medicines approved by your health care provider.  You may urinate more often. Painful urination may mean you have a bladder infection.  You may develop heartburn as a result of your pregnancy.  You may develop constipation because certain hormones are causing the muscles that push waste through your intestines to slow down.  You may develop hemorrhoids or swollen, bulging veins (varicose veins).  Your breasts may begin to grow larger and become tender. Your nipples may stick out more, and the tissue that surrounds them (areola) may become darker.  Your gums may bleed and may be sensitive to brushing and flossing.  Dark spots or blotches (chloasma,  mask of pregnancy) may develop on your face. This will likely fade after the baby is born.  Your menstrual periods will stop.  You may have a loss of appetite.  You may develop cravings for certain kinds of food.  You may have changes in your emotions from day to day, such as being excited to be pregnant or being concerned that something may go wrong with the pregnancy and baby.  You may have more vivid and strange dreams.  You may have changes in your hair. These can include thickening of your hair, rapid growth, and changes in texture. Some women also have hair loss during or after pregnancy, or hair that feels dry or thin. Your hair will most likely return to normal after your baby is born. WHAT TO EXPECT AT YOUR PRENATAL VISITS During a routine prenatal visit:  You will be weighed to make sure you and the baby are growing normally.  Your blood pressure will be taken.  Your abdomen will be measured to track your baby's growth.  The fetal heartbeat will be listened to starting around week 10 or 12 of your pregnancy.  Test results from any previous visits will be discussed. Your health care provider may ask you:  How you are feeling.  If you are feeling the baby move.  If you have had any abnormal symptoms, such as leaking fluid, bleeding, severe headaches, or abdominal cramping.  If you are using any tobacco products,   including cigarettes, chewing tobacco, and electronic cigarettes.  If you have any questions. Other tests that may be performed during your first trimester include:  Blood tests to find your blood type and to check for the presence of any previous infections. They will also be used to check for low iron levels (anemia) and Rh antibodies. Later in the pregnancy, blood tests for diabetes will be done along with other tests if problems develop.  Urine tests to check for infections, diabetes, or protein in the urine.  An ultrasound to confirm the proper growth  and development of the baby.  An amniocentesis to check for possible genetic problems.  Fetal screens for spina bifida and Down syndrome.  You may need other tests to make sure you and the baby are doing well.  HIV (human immunodeficiency virus) testing. Routine prenatal testing includes screening for HIV, unless you choose not to have this test. HOME CARE INSTRUCTIONS  Medicines  Follow your health care provider's instructions regarding medicine use. Specific medicines may be either safe or unsafe to take during pregnancy.  Take your prenatal vitamins as directed.  If you develop constipation, try taking a stool softener if your health care provider approves. Diet  Eat regular, well-balanced meals. Choose a variety of foods, such as meat or vegetable-based protein, fish, milk and low-fat dairy products, vegetables, fruits, and whole grain breads and cereals. Your health care provider will help you determine the amount of weight gain that is right for you.  Avoid raw meat and uncooked cheese. These carry germs that can cause birth defects in the baby.  Eating four or five small meals rather than three large meals a day may help relieve nausea and vomiting. If you start to feel nauseous, eating a few soda crackers can be helpful. Drinking liquids between meals instead of during meals also seems to help nausea and vomiting.  If you develop constipation, eat more high-fiber foods, such as fresh vegetables or fruit and whole grains. Drink enough fluids to keep your urine clear or pale yellow. Activity and Exercise  Exercise only as directed by your health care provider. Exercising will help you:  Control your weight.  Stay in shape.  Be prepared for labor and delivery.  Experiencing pain or cramping in the lower abdomen or low back is a good sign that you should stop exercising. Check with your health care provider before continuing normal exercises.  Try to avoid standing for long  periods of time. Move your legs often if you must stand in one place for a long time.  Avoid heavy lifting.  Wear low-heeled shoes, and practice good posture.  You may continue to have sex unless your health care provider directs you otherwise. Relief of Pain or Discomfort  Wear a good support bra for breast tenderness.   Take warm sitz baths to soothe any pain or discomfort caused by hemorrhoids. Use hemorrhoid cream if your health care provider approves.   Rest with your legs elevated if you have leg cramps or low back pain.  If you develop varicose veins in your legs, wear support hose. Elevate your feet for 15 minutes, 3-4 times a day. Limit salt in your diet. Prenatal Care  Schedule your prenatal visits by the twelfth week of pregnancy. They are usually scheduled monthly at first, then more often in the last 2 months before delivery.  Write down your questions. Take them to your prenatal visits.  Keep all your prenatal visits as directed by your   health care provider. Safety  Wear your seat belt at all times when driving.  Make a list of emergency phone numbers, including numbers for family, friends, the hospital, and police and fire departments. General Tips  Ask your health care provider for a referral to a local prenatal education class. Begin classes no later than at the beginning of month 6 of your pregnancy.  Ask for help if you have counseling or nutritional needs during pregnancy. Your health care provider can offer advice or refer you to specialists for help with various needs.  Do not use hot tubs, steam rooms, or saunas.  Do not douche or use tampons or scented sanitary pads.  Do not cross your legs for long periods of time.  Avoid cat litter boxes and soil used by cats. These carry germs that can cause birth defects in the baby and possibly loss of the fetus by miscarriage or stillbirth.  Avoid all smoking, herbs, alcohol, and medicines not prescribed by  your health care provider. Chemicals in these affect the formation and growth of the baby.  Do not use any tobacco products, including cigarettes, chewing tobacco, and electronic cigarettes. If you need help quitting, ask your health care provider. You may receive counseling support and other resources to help you quit.  Schedule a dentist appointment. At home, brush your teeth with a soft toothbrush and be gentle when you floss. SEEK MEDICAL CARE IF:   You have dizziness.  You have mild pelvic cramps, pelvic pressure, or nagging pain in the abdominal area.  You have persistent nausea, vomiting, or diarrhea.  You have a bad smelling vaginal discharge.  You have pain with urination.  You notice increased swelling in your face, hands, legs, or ankles. SEEK IMMEDIATE MEDICAL CARE IF:   You have a fever.  You are leaking fluid from your vagina.  You have spotting or bleeding from your vagina.  You have severe abdominal cramping or pain.  You have rapid weight gain or loss.  You vomit blood or material that looks like coffee grounds.  You are exposed to German measles and have never had them.  You are exposed to fifth disease or chickenpox.  You develop a severe headache.  You have shortness of breath.  You have any kind of trauma, such as from a fall or a car accident.   This information is not intended to replace advice given to you by your health care provider. Make sure you discuss any questions you have with your health care provider.   Document Released: 02/03/2001 Document Revised: 03/02/2014 Document Reviewed: 12/20/2012 Elsevier Interactive Patient Education 2016 [employer]  

## 2015-05-31 ENCOUNTER — Encounter (HOSPITAL_BASED_OUTPATIENT_CLINIC_OR_DEPARTMENT_OTHER): Payer: Self-pay | Admitting: Orthopedic Surgery

## 2015-08-10 DIAGNOSIS — O98912 Unspecified maternal infectious and parasitic disease complicating pregnancy, second trimester: Secondary | ICD-10-CM | POA: Diagnosis not present

## 2015-08-10 DIAGNOSIS — R1030 Lower abdominal pain, unspecified: Secondary | ICD-10-CM | POA: Diagnosis not present

## 2015-08-10 DIAGNOSIS — Z3A19 19 weeks gestation of pregnancy: Secondary | ICD-10-CM | POA: Insufficient documentation
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
## 2015-08-11 ENCOUNTER — Emergency Department (HOSPITAL_COMMUNITY)
Admission: EM | Admit: 2015-08-11 | Discharge: 2015-08-11 | Disposition: A | Discharge status: Home / Self Care | Payer: Medicaid Other | Attending: Emergency Medicine | Admitting: Emergency Medicine

## 2015-08-11 DIAGNOSIS — O98911 Unspecified maternal infectious and parasitic disease complicating pregnancy, first trimester: Secondary | ICD-10-CM
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
